# Patient Record
Sex: Male | Born: 1940 | Race: White | Hispanic: No | Marital: Married
Health system: Southern US, Community
[De-identification: ages and names within clinical notes are randomized; demographics above are authoritative.]

---

## 2009-05-30 ENCOUNTER — Ambulatory Visit: Payer: Self-pay

## 2009-06-18 ENCOUNTER — Ambulatory Visit: Payer: Self-pay | Admitting: Unknown Physician Specialty

## 2009-06-23 ENCOUNTER — Ambulatory Visit: Payer: Self-pay | Admitting: Unknown Physician Specialty

## 2009-07-30 ENCOUNTER — Ambulatory Visit: Payer: Self-pay | Admitting: Gastroenterology

## 2010-06-03 ENCOUNTER — Ambulatory Visit: Payer: Self-pay | Admitting: Vascular Surgery

## 2010-06-15 ENCOUNTER — Ambulatory Visit: Payer: Self-pay | Admitting: Vascular Surgery

## 2010-07-08 ENCOUNTER — Inpatient Hospital Stay: Payer: Self-pay | Admitting: Vascular Surgery

## 2011-04-16 IMAGING — CR DG CHEST 2V
1 series · 2 of 2 positions shown · non-contrast
Comparison: none

REASON FOR EXAM: [DATE]----HTN
COMMENTS:   LMP: (Male)

[Series 1: view not recorded · 0.17mm/px · 2 of 2 slices shown]
[im 1/2]
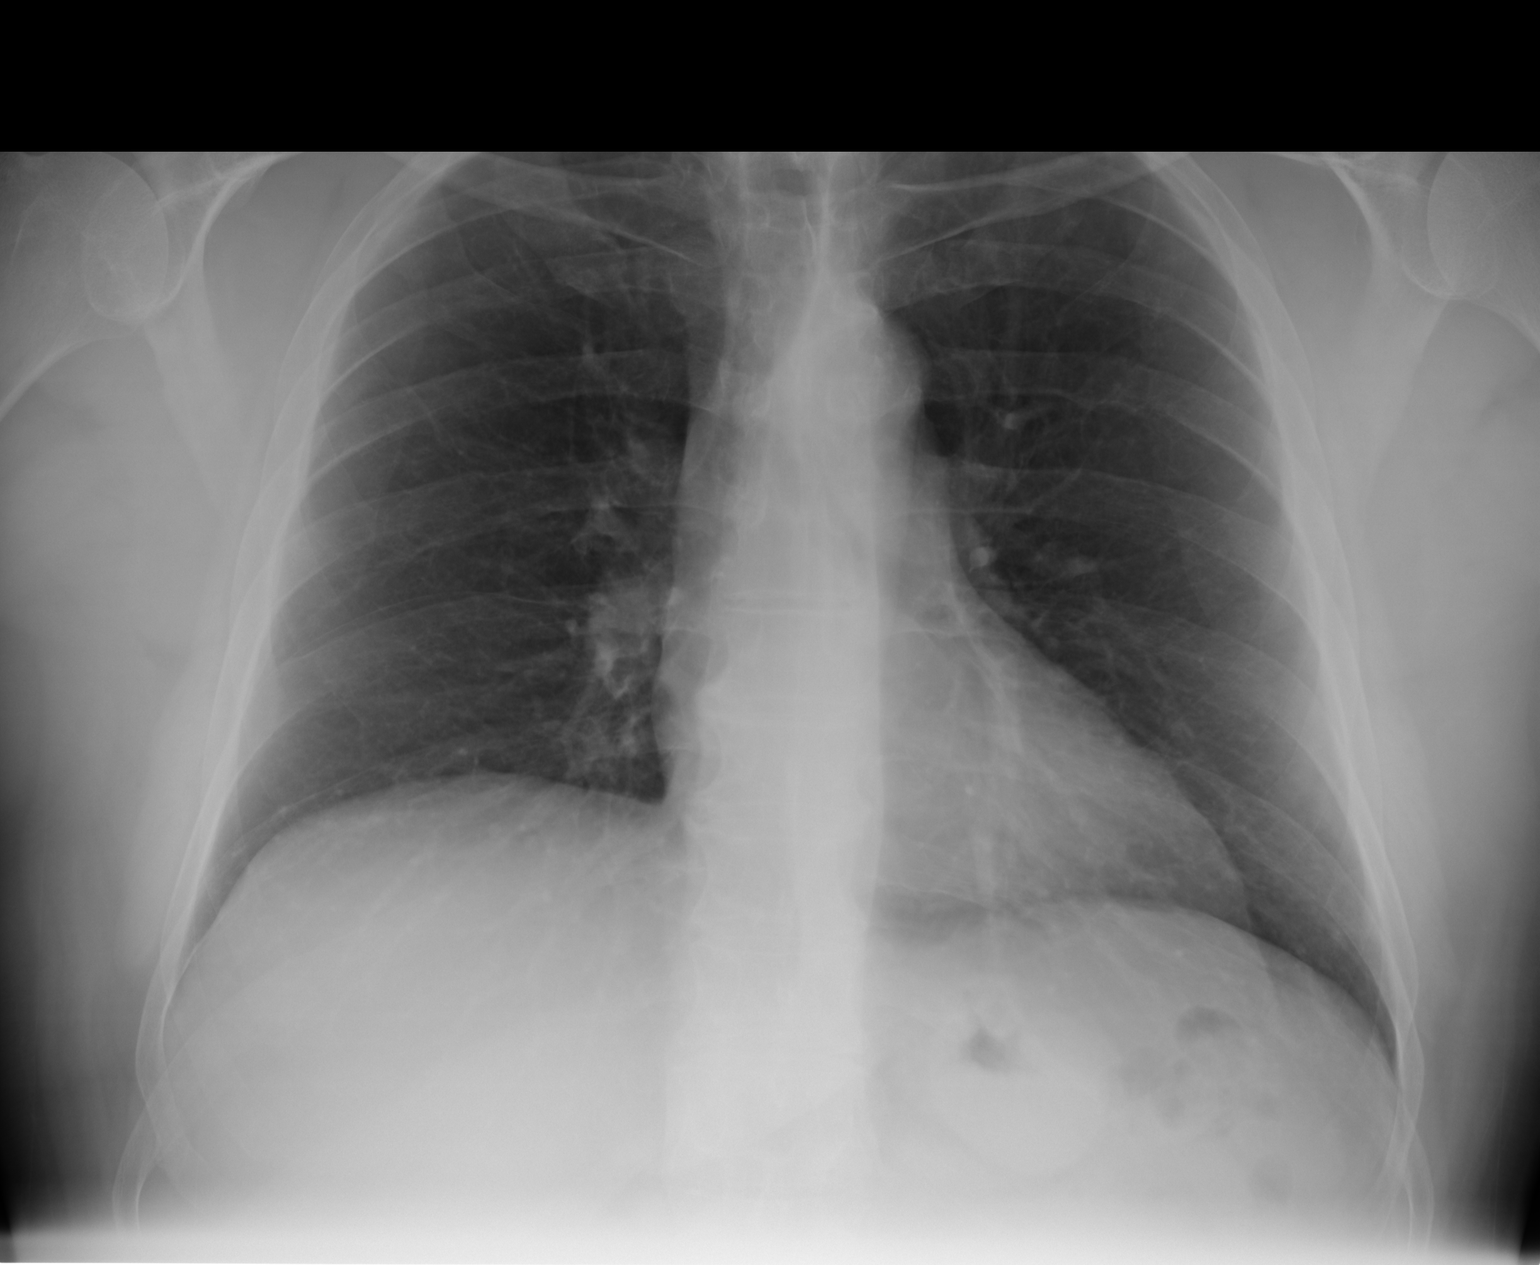
[im 2/2]
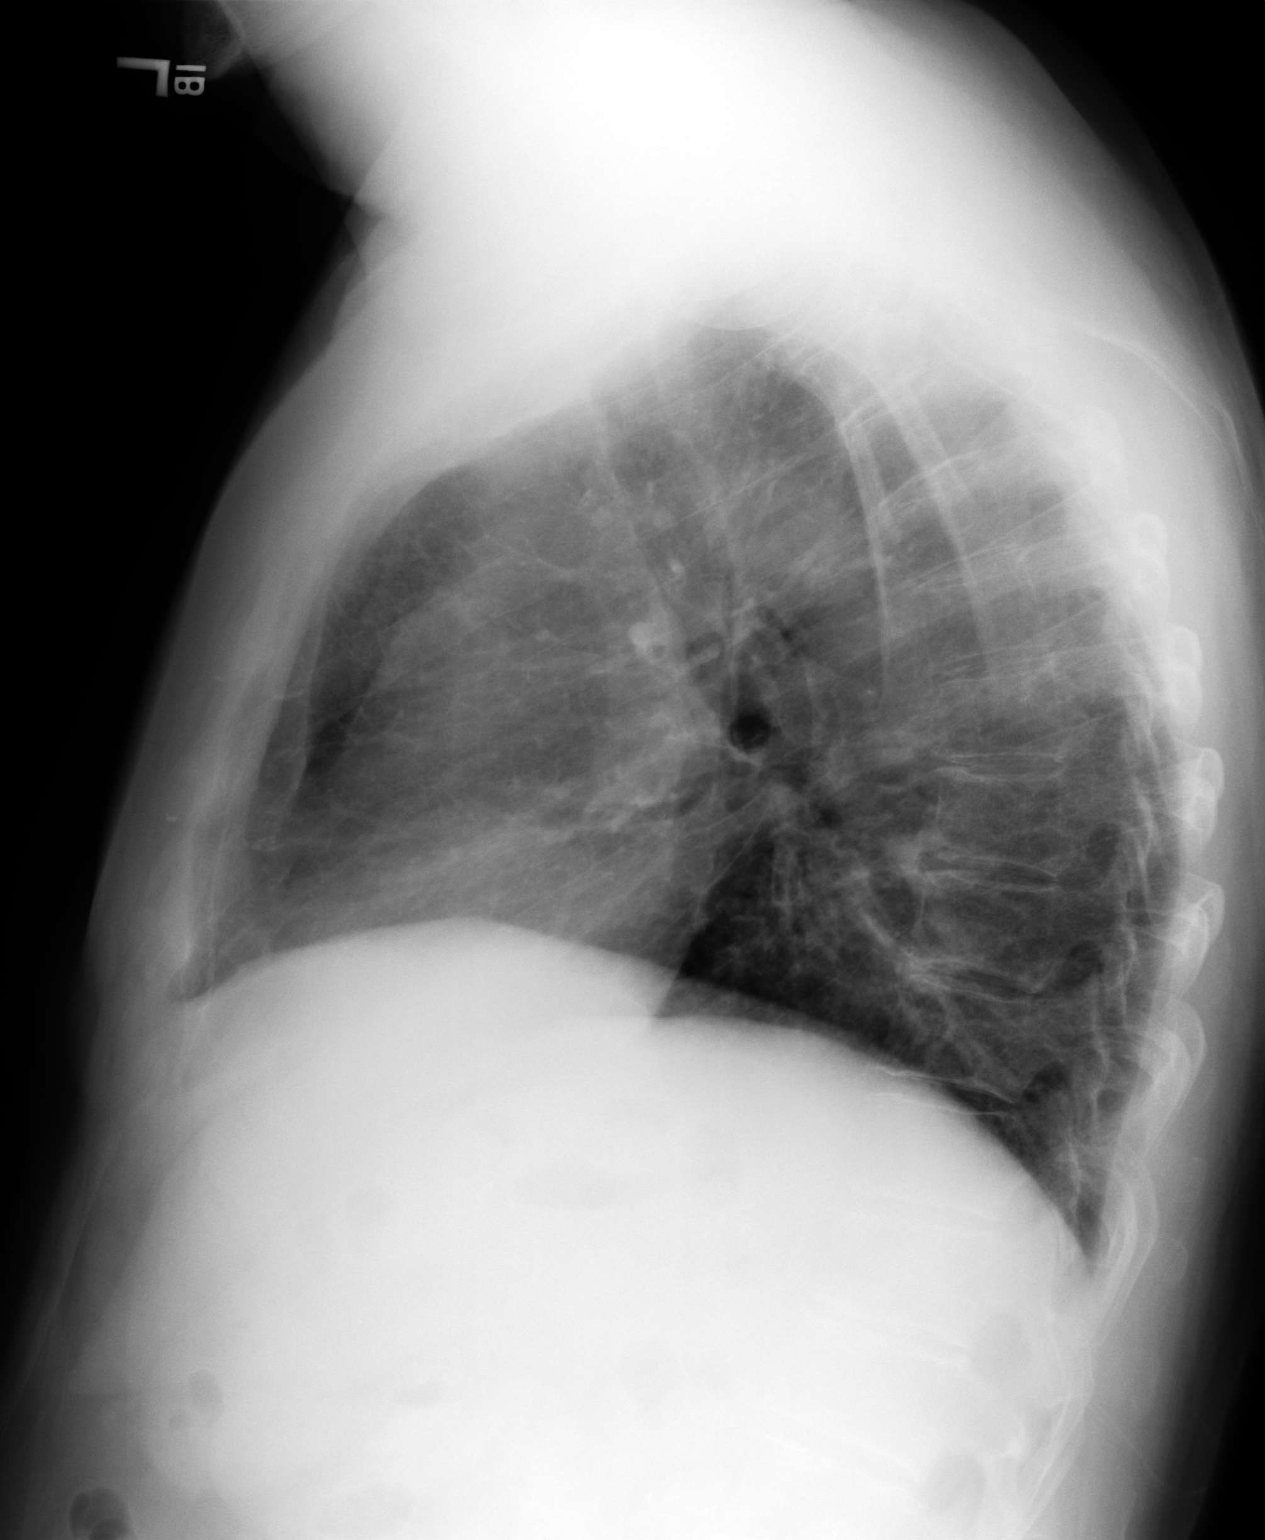

[2 of 2 positions shown; findings below may reference images not displayed]

PROCEDURE:     DXR - DXR CHEST PA (OR AP) AND LATERAL  - June 15, 2010  [DATE]

RESULT:     There is no previous exam for comparison.

The lungs are clear. The heart and pulmonary vessels are normal. The bony
and mediastinal structures are unremarkable. There is no effusion. There is
no pneumothorax or evidence of congestive failure.
IMPRESSION: No acute cardiopulmonary disease.

## 2012-03-08 ENCOUNTER — Emergency Department: Payer: Self-pay | Admitting: Emergency Medicine

## 2012-07-18 ENCOUNTER — Ambulatory Visit: Payer: Self-pay | Admitting: Ophthalmology

## 2012-07-30 ENCOUNTER — Ambulatory Visit: Payer: Self-pay | Admitting: Ophthalmology

## 2014-06-27 ENCOUNTER — Emergency Department: Payer: Self-pay | Admitting: Emergency Medicine

## 2014-06-27 ENCOUNTER — Ambulatory Visit: Payer: Self-pay | Admitting: Ophthalmology

## 2014-06-27 LAB — CBC WITH DIFFERENTIAL/PLATELET
Basophil #: 0.1 10*3/uL (ref 0.0–0.1)
Basophil %: 0.7 %
EOS ABS: 0.2 10*3/uL (ref 0.0–0.7)
EOS PCT: 3 %
HCT: 38.9 % — AB (ref 40.0–52.0)
HGB: 12.7 g/dL — AB (ref 13.0–18.0)
LYMPHS ABS: 1.7 10*3/uL (ref 1.0–3.6)
Lymphocyte %: 21.9 %
MCH: 29 pg (ref 26.0–34.0)
MCHC: 32.5 g/dL (ref 32.0–36.0)
MCV: 89 fL (ref 80–100)
Monocyte #: 0.8 x10 3/mm (ref 0.2–1.0)
Monocyte %: 9.6 %
NEUTROS PCT: 64.8 %
Neutrophil #: 5.1 10*3/uL (ref 1.4–6.5)
PLATELETS: 221 10*3/uL (ref 150–440)
RBC: 4.37 10*6/uL — ABNORMAL LOW (ref 4.40–5.90)
RDW: 14.2 % (ref 11.5–14.5)
WBC: 7.9 10*3/uL (ref 3.8–10.6)

## 2014-06-27 LAB — BASIC METABOLIC PANEL
Anion Gap: 8 (ref 7–16)
BUN: 22 mg/dL — ABNORMAL HIGH (ref 7–18)
CREATININE: 1.3 mg/dL (ref 0.60–1.30)
Calcium, Total: 9.1 mg/dL (ref 8.5–10.1)
Chloride: 105 mmol/L (ref 98–107)
Co2: 25 mmol/L (ref 21–32)
EGFR (African American): >60
EGFR (Non-African Amer.): 55 — ABNORMAL LOW
Glucose: 85 mg/dL (ref 65–99)
Osmolality: 278 (ref 275–301)
Potassium: 3.8 mmol/L (ref 3.5–5.1)
Sodium: 138 mmol/L (ref 136–145)

## 2014-12-31 ENCOUNTER — Ambulatory Visit: Payer: Self-pay | Admitting: Ophthalmology

## 2015-01-12 ENCOUNTER — Ambulatory Visit: Payer: Self-pay | Admitting: Ophthalmology

## 2015-02-10 NOTE — Op Note (Signed)
PATIENT NAME:  Stuart Gibson, Perrin F MR#:  [id number] DATE OF BIRTH:  1941/08/31  DATE OF PROCEDURE:  07/30/2012  PREOPERATIVE DIAGNOSIS: Cataract, right eye.   POSTOPERATIVE DIAGNOSIS: Cataract, right eye.   PROCEDURE PERFORMED: Extracapsular cataract extraction using phacoemulsification with placement of Alcon [id number] 20.0-diopter posterior chamber lens with 2.25 diopters of cylinder, serial number [id number].    SURGEON: Maylon PeppersSteven A. Citlali Gautney, MD    ANESTHESIA: 4% lidocaine, 0.75% Marcaine, 50-50 mixture with 10 units/mL of Hylenex added given as a peribulbar.   ANESTHESIOLOGIST: Dr. Henrene HawkingKephart    COMPLICATIONS: None.   ESTIMATED BLOOD LOSS: Less than 1 mL.   DESCRIPTION OF PROCEDURE: The patient is brought to the operating room and both eyes were anesthetized with topical proparacaine. Sitting upright an ASICO toric marker was used to mark the 3 and 9 o'clock positions. The patient was placed supine and given IV sedation and peribulbar block. He was then prepped and draped in the usual fashion. The vertical rectus muscles were imbricated using 5-0 silk sutures, bridle sutures. The degree marker was used to mark the 5 degree mark and the 95 degree mark. Conjunctiva was taken down at 95 degrees and a partial thickness scleral groove was made at the posterior surgical limbus. This was dissected anteriorly into clear cornea with an Alcon crescent knife. The anterior chamber was entered superonasally through clear cornea with a paracentesis knife and through the lamellar dissection with a 2.6 mm keratome. DisCoVisc was used to replace the aqueous and a continuous tear circular capsulorrhexis was carried out without difficulty. Phacoemulsification was carried out in a divide-and-conquer technique. Ultrasound time was 1 minute and 38 seconds with an average power of 20.7%, CDE of 34.23. Irrigation/aspiration handpiece was used to remove the residual cortex. The capsular bag was inflated with DisCoVisc and  intraocular lens was inserted in the capsular bag under direct visualization. It was rotated to line the marks up with the 5 degree marks. Irrigation-aspiration was used to remove the DisCoVisc. Care was taken to make sure that the lens remained in position aligned with the 5 degree marks. The wound was inflated with balanced salt. Miostat was injected through the paracentesis track. The bridle sutures were removed. Two drops of Vigamox were placed in the eye and a shield was placed on the eye. The patient was discharged to the recovery room in good condition.   ____________________________ Maylon PeppersSteven A. Glorious Flicker, MD sad:drc D: 07/30/2012 13:49:26 ET T: 07/30/2012 14:25:44 ET JOB#: [id number]  cc: Viviann SpareSteven A. Gustavia Carie, MD, <Dictator> Erline LevineSTEVEN A Jeaneen Cala MD ELECTRONICALLY SIGNED 08/06/2012 14:11

## 2015-02-22 NOTE — Op Note (Signed)
PATIENT NAME:  Stuart Gibson, [name] MR#:  [id number] DATE OF BIRTH:  09-16-1941  DATE OF PROCEDURE:  01/12/2015  PREOPERATIVE DIAGNOSIS: Nuclear sclerotic cataract, left eye.   POSTOPERATIVE DIAGNOSIS: Nuclear sclerotic cataract, left eye.   PROCEDURE PERFORMED: Extracapsular cataract extraction using phacoemulsification with placement of an Alcon SN6AT6, 9.5 diopter posterior chamber lens with 3.75 diopters cylinder, serial #[id number].   ANESTHESIA: Lidocaine 4%, 0.75% Marcaine a 50-50 mixture with 10 units/mL of Hylenex added given as a peribulbar.   ANESTHESIOLOGIST:  Dr. Pernell DupreAdams.   COMPLICATIONS: None.   ESTIMATED BLOOD LOSS: Less than 1 mL.  DESCRIPTION OF PROCEDURE:  The patient was brought to the operating room and each eye was anesthetized with topical proparacaine. With the patient sitting upright, the 3 and 9 o'clock positions were marked using a marking pen. He was then placed supine, given IV sedation, and a peribulbar block. He was prepped and draped in the usual fashion. Superior bridle sutures were placed using a 5-0 silk suture as the bridle sutures. The Verion unit was turned on and registration occurred. The position of the incisions at 12 o'clock or 90 degrees and the incision at 164 degrees were then marked with a marking pen. The Verion unit was turned off. Hemostasis was obtained at the limbus with cautery and a partial thickness scleral groove was made at the mid surgical limbus. This was dissected anteriorly into clear cornea with an Alcon crescent knife. The anterior chamber was entered superotemporally through clear cornea with a paracentesis knife and through the lamellar dissection with a 2.6 mm keratome. DisCoVisc was used to place the aqueous and continuous tear circular capsulorrhexis was carried out. Hydrodissection was performed with balanced salt and phacoemulsification was carried out using a divide-and-conquer technique. Ultrasound time was 1 minute and 13.9 seconds.   Average power was 25.6%. CDE of 32.79. Irrigation-aspiration was used to remove the residual cortex. The capsular bag was inflated with DisCoVisc and the Verion unit was turned back on. The intraocular lens was inserted using a [employer] shooter. The lens was rotated and the marks on the haptics were aligned with the marks from the Verion unit. Centration was turned on to confirm that the lens was centered over the pupil. Irrigation-aspiration was used to remove the residual DisCoVisc and the wound was inflated with balanced salt. The anterior chamber was deepened using Miostat through the paracentesis tract. The Verion unit was then used to check the position of the lens again and it was in good position. A tenth of a milliliter of cefuroxime containing 1 mg of drug was injected via the paracentesis track. Again, the position of the lens was checked and found to be good. The wound was checked for leaks. None were found. The bridle sutures were removed and 2 drops of Vigamox were placed on the eye. A shield was placed over the eye and the patient was discharged to the recovery area in good condition.   ____________________________ Maylon PeppersSteven A. Cutter Passey, MD sad:sp D: 01/12/2015 14:11:42 ET T: 01/12/2015 14:41:28 ET JOB#: [id number]  cc: Viviann SpareSteven A. [place], MD, <Dictator> Erline LevineSTEVEN A Cristina Ceniceros MD ELECTRONICALLY SIGNED 01/19/2015 11:14

## 2015-11-25 DEATH — deceased
